# Patient Record
Sex: Female | Born: 2000 | Race: White | Hispanic: No | Marital: Single | Smoking: Never smoker
Health system: Southern US, Community
[De-identification: ages and names within clinical notes are randomized; demographics above are authoritative.]

## PROBLEM LIST (undated history)

## (undated) HISTORY — PX: ABDOMINAL SURGERY: SHX537

## (undated) HISTORY — PX: GASTROSTOMY TUBE PLACEMENT: SHX655

## (undated) HISTORY — PX: CENTRAL LINE INSERTION: CATH118232

## (undated) HISTORY — PX: COLON SURGERY: SHX602

---
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
## 2021-02-06 ENCOUNTER — Other Ambulatory Visit: Payer: Self-pay | Admitting: Obstetrics and Gynecology

## 2021-02-06 DIAGNOSIS — Z30013 Encounter for initial prescription of injectable contraceptive: Secondary | ICD-10-CM

## 2021-02-08 ENCOUNTER — Other Ambulatory Visit: Payer: Self-pay | Admitting: Obstetrics and Gynecology

## 2021-02-08 ENCOUNTER — Ambulatory Visit (INDEPENDENT_AMBULATORY_CARE_PROVIDER_SITE_OTHER): Payer: Medicaid Other

## 2021-02-08 ENCOUNTER — Ambulatory Visit: Payer: Medicaid Other

## 2021-02-08 ENCOUNTER — Other Ambulatory Visit: Payer: Self-pay

## 2021-02-08 DIAGNOSIS — Z30013 Encounter for initial prescription of injectable contraceptive: Secondary | ICD-10-CM

## 2021-02-08 DIAGNOSIS — Z3042 Encounter for surveillance of injectable contraceptive: Secondary | ICD-10-CM | POA: Diagnosis not present

## 2021-02-08 LAB — POCT URINE PREGNANCY: Preg Test, Ur: NEGATIVE

## 2021-02-08 MED ORDER — MEDROXYPROGESTERONE ACETATE 150 MG/ML IM SUSP
150.0000 mg | Freq: Once | INTRAMUSCULAR | Status: AC
  Administered 2021-02-08: 150 mg via INTRAMUSCULAR

## 2021-02-08 NOTE — Progress Notes (Signed)
Pt here for depo which was given IM left deltoid after neg urine preg test obtained.  NDC# [phone]

## 2021-02-20 NOTE — Progress Notes (Deleted)
PCP:  P[facility]s, [facility]   No chief complaint on file.    HPI:      [name] is a 20 y.o. No obstetric history on file. whose LMP was No LMP recorded., presents today for her annual examination.  Her menses are absent with depo.  Dysmenorrhea {dysmen:716}. She {does:18564} have intermenstrual bleeding.  Sex activity: {sex active:315163}.  Last Pap: N/A due to age Hx of STDs: {STD hx:14358}  There is no FH of breast cancer. There is no FH of ovarian cancer. The patient {does:18564} do self-breast exams.  Tobacco use: {tob:20664} Alcohol use: {Alcohol:11675} No drug use.  Exercise: {exercise:31265}  She {does:18564} get adequate calcium and Vitamin D in her diet.   The pregnancy intention screening data noted above was reviewed. Potential methods of contraception were discussed. The patient elected to proceed with {Upstream End Methods:24109}.     Past Medical History:  Diagnosis Date  . Cystic fibrosis (HCC)   . Meconium ileus of the newborn     Past Surgical History:  Procedure Laterality Date  . ABDOMINAL SURGERY    . CENTRAL LINE INSERTION    . COLON SURGERY    . GASTROSTOMY TUBE PLACEMENT      No family history on file.  Social History   Socioeconomic History  . Marital status: Single    Spouse name: Not on file  . Number of children: Not on file  . Years of education: Not on file  . Highest education level: Not on file  Occupational History  . Not on file  Tobacco Use  . Smoking status: Never Smoker  . Smokeless tobacco: Never Used  Vaping Use  . Vaping Use: Never used  Substance and Sexual Activity  . Alcohol use: No  . Drug use: Never  . Sexual activity: Never    Birth control/protection: None  Other Topics Concern  . Not on file  Social History Narrative   ** Merged History Encounter **       Social Determinants of Health   Financial Resource Strain: Not on file  Food Insecurity: Not on file  Transportation Needs:  Not on file  Physical Activity: Not on file  Stress: Not on file  Social Connections: Not on file  Intimate Partner Violence: Not on file     Current Outpatient Medications:  .  albuterol (PROVENTIL) (2.5 MG/3ML) 0.083% nebulizer solution, Inhale into the lungs., Disp: , Rfl:  .  albuterol (VENTOLIN HFA) 108 (90 Base) MCG/ACT inhaler, Inhale 2 puffs by mouth twice daily with ACT.  May also have every four hours as needed for wheezing, coughing, or shortness of breath., Disp: , Rfl:  .  Aztreonam Lysine 75 MG SOLR, Inhale into the lungs., Disp: , Rfl:  .  cyproheptadine (PERIACTIN) 4 MG tablet, Take by mouth., Disp: , Rfl:  .  dornase alpha (PULMOZYME) 1 MG/ML nebulizer solution, Inhale into the lungs., Disp: , Rfl:  .  fluticasone (FLONASE) 50 MCG/ACT nasal spray, Place into the nose., Disp: , Rfl:  .  Fluticasone-Umeclidin-Vilant (TRELEGY ELLIPTA) 200-62.5-25 MCG/INH AEPB, Inhale into the lungs., Disp: , Rfl:  .  medroxyPROGESTERone Acetate 150 MG/ML SUSY, INJECT 1 ML (150 MG TOTAL) INTO THE MUSCLE ONCE FOR 1 DOSE., Disp: 1 mL, Rfl: 0 .  Multiple Vitamins-Minerals (MVW COMPLETE FORMULATION) CAPS, Take by mouth., Disp: , Rfl:  .  Nutritional Supplements (KATE FARMS PEPTIDE 1.5) LIQD, Molli Posey Pediatric Peptide 1.5 : Bolus 270 mL 3x per day. May provide  given overnight.  Dose enzymes prior to each feed., Disp: , Rfl:  .  Nutritional Supplements (SCANDISHAKE) PACK, Consume 1 to 2 shakes per day as tolerated., Disp: , Rfl:  .  omeprazole (PRILOSEC) 20 MG capsule, Take by mouth., Disp: , Rfl:  .  Pancrelipase, Lip-Prot-Amyl, (ZENPEP) 25000-79000 units CPEP, Take 3 capsules with meals 3x per day.  Take 2 capsules with snacks 4x daily. Take 2 capsules with bolus G tube feeds 2x per day., Disp: , Rfl:  .  polyethylene glycol powder (GLYCOLAX/MIRALAX) 17 GM/SCOOP powder, Take by mouth., Disp: , Rfl:  .  Sodium Chloride, Inhalant, 7 % NEBU, Inhale into the lungs., Disp: , Rfl:  .  tobramycin,  PF, (TOBI) 300 MG/5ML nebulizer solution, Inhale into the lungs., Disp: , Rfl:      ROS:  Review of Systems BREAST: No symptoms   Objective: There were no vitals taken for this visit.   OBGyn Exam  Results: No results found for this or any previous visit (from the past 24 hour(s)).  Assessment/Plan: No diagnosis found.  No orders of the defined types were placed in this encounter.            GYN counsel {counseling:16159}     F/U  No follow-ups on file.  Regina Coppolino B. Virgina Deakins, PA-C 02/20/2021 4:00 PM

## 2021-02-21 ENCOUNTER — Ambulatory Visit: Payer: Medicaid Other | Admitting: Obstetrics and Gynecology

## 2021-05-01 ENCOUNTER — Other Ambulatory Visit: Payer: Self-pay

## 2021-05-01 DIAGNOSIS — Z30013 Encounter for initial prescription of injectable contraceptive: Secondary | ICD-10-CM

## 2021-05-01 MED ORDER — MEDROXYPROGESTERONE ACETATE 150 MG/ML IM SUSY: 150.0000 mg | PREFILLED_SYRINGE | Freq: Once | INTRAMUSCULAR | 0 refills | Status: DC

## 2021-05-01 NOTE — Telephone Encounter (Signed)
Pt's mom, Victorino Dike, calling for depo refill; appt Friday for inj.  [phone]  [facility] aware refill sent in and need to schedule annual.

## 2021-05-03 ENCOUNTER — Other Ambulatory Visit: Payer: Self-pay

## 2021-05-03 ENCOUNTER — Ambulatory Visit (INDEPENDENT_AMBULATORY_CARE_PROVIDER_SITE_OTHER): Payer: Medicaid Other

## 2021-05-03 DIAGNOSIS — Z3042 Encounter for surveillance of injectable contraceptive: Secondary | ICD-10-CM | POA: Diagnosis not present

## 2021-05-03 MED ORDER — MEDROXYPROGESTERONE ACETATE 150 MG/ML IM SUSP
150.0000 mg | Freq: Once | INTRAMUSCULAR | Status: AC
  Administered 2021-05-03: 150 mg via INTRAMUSCULAR

## 2021-05-03 NOTE — Progress Notes (Signed)
Pt here for depo which was given IM left deltoid.  NDC# [phone] 

## 2021-06-17 ENCOUNTER — Ambulatory Visit: Payer: Medicaid Other | Admitting: Obstetrics and Gynecology

## 2021-06-17 ENCOUNTER — Other Ambulatory Visit: Payer: Self-pay

## 2021-07-18 ENCOUNTER — Ambulatory Visit: Payer: Medicaid Other | Admitting: Obstetrics and Gynecology

## 2021-07-18 NOTE — Progress Notes (Deleted)
Pediatrics, [facility]   No chief complaint on file.   HPI:      [name] is a 20 y.o. No obstetric history on file. who LMP was No LMP recorded., [facility]. Menses are monthly, last 7 days, mild dysmen, no BTB. Has never been sex active, but plans to be. No hx of HTN, DVTs, migraines with aura. Has a hx of CF; depo approved by her [facility] MD. She gets plenty of calcium/Vit D in her diet.  No tob/alcohol/drug use.  Occas exercise.  No FH breast/ovar ca.   Past Medical History:  Diagnosis Date   Cystic fibrosis (HCC)    Meconium ileus of the newborn     Past Surgical History:  Procedure Laterality Date   ABDOMINAL SURGERY     CENTRAL LINE INSERTION     COLON SURGERY     GASTROSTOMY TUBE PLACEMENT      No family history on file.  Social History   Socioeconomic History   Marital status: Single    Spouse name: Not on file   Number of children: Not on file   Years of education: Not on file   Highest education level: Not on file  Occupational History   Not on file  Tobacco Use   Smoking status: Never   Smokeless tobacco: Never  Vaping Use   Vaping Use: Never used  Substance and Sexual Activity   Alcohol use: No   Drug use: Never   Sexual activity: Never    Birth control/protection: None  Other Topics Concern   Not on file  Social History Narrative   ** Merged History Encounter **       Social Determinants of Health   Financial Resource Strain: Not on file  Food Insecurity: Not on file  Transportation Needs: Not on file  Physical Activity: Not on file  Stress: Not on file  Social Connections: Not on file  Intimate Partner Violence: Not on file    Outpatient Medications Prior to Visit  Medication Sig Dispense Refill   albuterol (PROVENTIL) (2.5 MG/3ML) 0.083% nebulizer solution Inhale into the lungs.     albuterol (VENTOLIN HFA) 108 (90 Base) MCG/ACT inhaler Inhale 2 puffs by mouth twice daily with  ACT.  May also have every four hours as needed for wheezing, coughing, or shortness of breath.     Aztreonam Lysine 75 MG SOLR Inhale into the lungs.     cyproheptadine (PERIACTIN) 4 MG tablet Take by mouth.     dornase alpha (PULMOZYME) 1 MG/ML nebulizer solution Inhale into the lungs.     fluticasone (FLONASE) 50 MCG/ACT nasal spray Place into the nose.     Fluticasone-Umeclidin-Vilant (TRELEGY ELLIPTA) 200-62.5-25 MCG/INH AEPB Inhale into the lungs.     medroxyPROGESTERone Acetate 150 MG/ML SUSY Inject 1 mL (150 mg total) into the muscle once for 1 dose. 1 mL 0   Multiple Vitamins-Minerals (MVW COMPLETE FORMULATION) CAPS Take by mouth.     Nutritional Supplements (KATE Lawrence PEPTIDE 1.5) LIQD Mary Lawrence Pediatric Peptide 1.5 : Bolus 270 mL 3x per day. May provide 125mL given overnight.  Dose enzymes prior to each feed.     Nutritional Supplements (SCANDISHAKE) PACK Consume 1 to 2 shakes per day as tolerated.     omeprazole (PRILOSEC) 20 MG capsule Take by mouth.     Pancrelipase, Lip-Prot-Amyl, (ZENPEP) 25000-79000 units CPEP Take 3 capsules with meals 3x per day.  Take 2 capsules with  snacks 4x daily. Take 2 capsules with bolus G tube feeds 2x per day.     polyethylene glycol powder (GLYCOLAX/MIRALAX) 17 GM/SCOOP powder Take by mouth.     Sodium Chloride, Inhalant, 7 % NEBU Inhale into the lungs.     tobramycin, PF, (TOBI) 300 MG/5ML nebulizer solution Inhale into the lungs.     No facility-administered medications prior to visit.      ROS:  Review of Systems  Constitutional:  Negative for fever.  Gastrointestinal:  Negative for blood in stool, constipation, diarrhea, nausea and vomiting.  Genitourinary:  Negative for dyspareunia, dysuria, flank pain, frequency, hematuria, urgency, vaginal bleeding, vaginal discharge and vaginal pain.  Musculoskeletal:  Negative for back pain.  Skin:  Negative for rash. BREAST: No symptoms   OBJECTIVE:   Vitals:  There were no vitals taken for  this visit.  Physical Exam Vitals reviewed.  Constitutional:      Appearance: She is well-developed.  Pulmonary:     Effort: Pulmonary effort is normal.  Musculoskeletal:        General: Normal range of motion.     Cervical back: Normal range of motion.  Skin:    General: Skin is warm and dry.  Neurological:     General: No focal deficit present.     Mental Status: She is alert and oriented to person, place, and time.     Cranial Nerves: No cranial nerve deficit.  Psychiatric:        Mood and Affect: Mood normal.        Behavior: Behavior normal.        Thought Content: Thought content normal.        Judgment: Judgment normal.    Assessment/Plan: Encounter for initial prescription of injectable contraceptive - Plan: medroxyPROGESTERone Acetate 150 MG/ML SUSY; Depo start with next menses. Rx eRxd. Condoms. Make sure to get ca/Vit D. F/u prn.    No orders of the defined types were placed in this encounter.     No follow-ups on file.  Bohden Dung B. Alira Fretwell, PA-C 07/18/2021 11:49 AM

## 2021-07-23 ENCOUNTER — Other Ambulatory Visit: Payer: Self-pay | Admitting: Obstetrics and Gynecology

## 2021-07-23 DIAGNOSIS — Z30013 Encounter for initial prescription of injectable contraceptive: Secondary | ICD-10-CM

## 2021-07-26 ENCOUNTER — Ambulatory Visit: Payer: Medicaid Other

## 2021-08-26 ENCOUNTER — Other Ambulatory Visit: Payer: Self-pay | Admitting: Obstetrics and Gynecology

## 2021-08-26 DIAGNOSIS — Z30013 Encounter for initial prescription of injectable contraceptive: Secondary | ICD-10-CM

## 2021-09-27 ENCOUNTER — Other Ambulatory Visit: Payer: Self-pay | Admitting: Obstetrics and Gynecology

## 2021-09-27 DIAGNOSIS — Z30013 Encounter for initial prescription of injectable contraceptive: Secondary | ICD-10-CM

## 2021-10-27 ENCOUNTER — Emergency Department (HOSPITAL_COMMUNITY)
Admission: EM | Admit: 2021-10-27 | Discharge: 2021-10-27 | Disposition: A | Discharge status: Home / Self Care | Payer: Medicaid Other | Attending: Emergency Medicine | Admitting: Emergency Medicine

## 2021-10-27 ENCOUNTER — Other Ambulatory Visit: Payer: Self-pay

## 2021-10-27 ENCOUNTER — Encounter (HOSPITAL_COMMUNITY): Payer: Self-pay | Admitting: Emergency Medicine

## 2021-10-27 ENCOUNTER — Emergency Department (HOSPITAL_COMMUNITY): Payer: Medicaid Other

## 2021-10-27 DIAGNOSIS — S9032XA Contusion of left foot, initial encounter: Secondary | ICD-10-CM

## 2021-10-27 DIAGNOSIS — W228XXA Striking against or struck by other objects, initial encounter: Secondary | ICD-10-CM | POA: Insufficient documentation

## 2021-10-27 DIAGNOSIS — S99922A Unspecified injury of left foot, initial encounter: Secondary | ICD-10-CM | POA: Diagnosis present

## 2021-10-27 NOTE — ED Provider Notes (Signed)
[facility] EMERGENCY DEPARTMENT Provider Note   CSN: [id number] Arrival date & time: 10/27/21  1248     History  Chief Complaint  Patient presents with   Foot Pain    Mary Lawrence is a 21 y.o. female.   Foot Pain  Patient presents to the emergency room for evaluation of left foot pain.  Patient states she was doing a cartwheel last night and she ended up striking her foot on the curb.  Now she is having pain in her foot and it is hard to walk or stand.  She denies any other injuries.  She denies have any ankle pain.    Home Medications Prior to Admission medications   Medication Sig Start Date End Date Taking? Authorizing Provider  albuterol (PROVENTIL) (2.5 MG/3ML) 0.083% nebulizer solution Inhale into the lungs. 03/24/19   [provider]  albuterol (VENTOLIN HFA) 108 (90 Base) MCG/ACT inhaler Inhale 2 puffs by mouth twice daily with ACT.  May also have every four hours as needed for wheezing, coughing, or shortness of breath. 05/18/19   [provider]  Aztreonam Lysine 75 MG SOLR Inhale into the lungs. 04/08/19   [provider]  cyproheptadine (PERIACTIN) 4 MG tablet Take by mouth. 05/18/19   [provider]  dornase alpha (PULMOZYME) 1 MG/ML nebulizer solution Inhale into the lungs. 05/18/19   [provider]  fluticasone (FLONASE) 50 MCG/ACT nasal spray Place into the nose. 11/25/18 11/25/19  [provider]  Fluticasone-Umeclidin-Vilant (TRELEGY ELLIPTA) 200-62.5-25 MCG/INH AEPB Inhale into the lungs. 10/21/19   [provider]  medroxyPROGESTERone Acetate 150 MG/ML SUSY INJECT 1 ML (150 MG TOTAL) INTO THE MUSCLE ONCE FOR 1 DOSE. 08/26/21 08/26/21  Copland, Ilona Sorrel, PA-C  Multiple Vitamins-Minerals (MVW COMPLETE FORMULATION) CAPS Take by mouth. 10/07/19   [provider]  Nutritional Supplements (KATE FARMS PEPTIDE 1.5) LIQD Molli Posey Pediatric Peptide 1.5 : Bolus 270 mL 3x per day. May  provide given overnight.  Dose enzymes prior to each feed. 06/16/19   [provider]  Nutritional Supplements (SCANDISHAKE) PACK Consume 1 to 2 shakes per day as tolerated. 04/25/19   [provider]  omeprazole (PRILOSEC) 20 MG capsule Take by mouth. 05/18/19   [provider]  Pancrelipase, Lip-Prot-Amyl, (ZENPEP) 25000-79000 units CPEP Take 3 capsules with meals 3x per day.  Take 2 capsules with snacks 4x daily. Take 2 capsules with bolus G tube feeds 2x per day. 10/07/19   [provider]  polyethylene glycol powder (GLYCOLAX/MIRALAX) 17 GM/SCOOP powder Take by mouth. 06/29/19   [provider]  Sodium Chloride, Inhalant, 7 % NEBU Inhale into the lungs. 05/18/19   [provider]  tobramycin, PF, (TOBI) 300 MG/5ML nebulizer solution Inhale into the lungs. 05/18/19   [provider]      Allergies    Other, Rifampin, Chlorhexidine gluconate, and Tape    Review of Systems   Review of Systems  Physical Exam Updated Vital Signs BP 128/86    Pulse 97    Temp 98.1 F (36.7 C) (Oral)    Resp 12    SpO2 94%  Physical Exam Vitals and nursing note reviewed.  Constitutional:      General: She is not in acute distress.    Appearance: She is well-developed.  HENT:     Head: Normocephalic and atraumatic.     Right Ear: External ear normal.     Left Ear: External ear normal.  Eyes:  General: No scleral icterus.       Right eye: No discharge.        Left eye: No discharge.     Conjunctiva/sclera: Conjunctivae normal.  Neck:     Trachea: No tracheal deviation.  Cardiovascular:     Rate and Rhythm: Normal rate.  Pulmonary:     Effort: Pulmonary effort is normal. No respiratory distress.     Breath sounds: No stridor.  Abdominal:     General: There is no distension.  Musculoskeletal:        General: Tenderness present. No swelling or deformity.     Cervical back: Neck supple.     Comments: Tenderness palpation left heel, no  tenderness midfoot, no tenderness medial or lateral malleolus, no proximal fibular tenderness  Skin:    General: Skin is warm and dry.     Findings: No rash.     Comments: Skin without signs of bruising or laceration  Neurological:     Mental Status: She is alert.     Cranial Nerves: Cranial nerve deficit: no gross deficits.    ED Results / Procedures / Treatments   Labs (all labs ordered are listed, but only abnormal results are displayed) Labs Reviewed - No data to display  EKG None  Radiology DG Foot Complete Left  Result Date: 10/27/2021 CLINICAL DATA:  Pain after a cartwheel accident. EXAM: LEFT FOOT - COMPLETE 3+ VIEW COMPARISON:  None. FINDINGS: There is no evidence of fracture or dislocation. There is no evidence of arthropathy or other focal bone abnormality. Soft tissues are unremarkable. IMPRESSION: Negative. Electronically Signed   By: Kennith Center M.D.   On: 10/27/2021 13:45    Procedures Procedures    Medications Ordered in ED Medications - No data to display  ED Course/ Medical Decision Making/ A&P Clinical Course as of 10/27/21 1413  Sun Oct 27, 2021  1413 DG Foot Complete Left X-ray images and radiology report reviewed.  No signs of fracture or dislocation [JK]    Clinical Course User Index [JK] Linwood Dibbles, MD                           Medical Decision Making Amount and/or Complexity of Data Reviewed Radiology: ordered.  X-ray without signs of serious injury.  Patient most likely has a contusion of the heel.  I doubt occult fracture.  Will provide crutches for support.  Discussed use of NSAIDs Tylenol.  Outpatient follow-up with orthopedics if symptoms do not resolve         Final Clinical Impression(s) / ED Diagnoses Final diagnoses:  Contusion of left heel, initial encounter    Rx / DC Orders ED Discharge Orders     None         Linwood Dibbles, MD 10/27/21 1413

## 2021-10-27 NOTE — ED Notes (Signed)
Pt d/c home with visitor per MD order. Discharge summary reviewed, pt verbalizes understanding. Off unit via WC- No s/s of acute distress noted at discharge.  

## 2021-10-27 NOTE — ED Triage Notes (Signed)
C/o L foot pain.  States she was wearing high heels last night when she did a cartwheel and the heel hit the curb and now having L heel pain.

## 2021-10-27 NOTE — Discharge Instructions (Signed)
Take ibuprofen or Tylenol as needed for pain.  You can try applying ice for the next couple of days to help with the pain and discomfort.  Follow-up with an orthopedic doctor your symptoms or not improving in a week or 2.  Use the crutches as needed to rest your heel

## 2021-10-27 NOTE — ED Notes (Signed)
Pt transported to xray 

## 2021-11-17 ENCOUNTER — Other Ambulatory Visit: Payer: Self-pay | Admitting: Obstetrics and Gynecology

## 2021-11-17 DIAGNOSIS — Z30013 Encounter for initial prescription of injectable contraceptive: Secondary | ICD-10-CM
